# Patient Record
Sex: Female | Born: 2000 | Race: Black or African American | Hispanic: No | Marital: Single | Smoking: Never smoker
Health system: Southern US, Community
[De-identification: ages and names within clinical notes are randomized; demographics above are authoritative.]

---
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
## 2022-03-03 IMAGING — US US  BREAST BX W/ LOC DEV 1ST LESION IMG BX SPEC US GUIDE*R*
1 series · 13 of 14 positions shown · non-contrast
Comparison: Previous exam(s).
COMPARISON: Previous exam(s).

Addendum:
CLINICAL DATA: 19-year-old female presenting for ultrasound-guided
biopsy of a right breast mass.

EXAM:
ULTRASOUND GUIDED RIGHT BREAST CORE NEEDLE BIOPSY

[Series 1: us breast bx w/ loc dev 1st lesion img bx spec us  · 0.08mm/px · 13 of 14 slices shown]
[im 1/14]
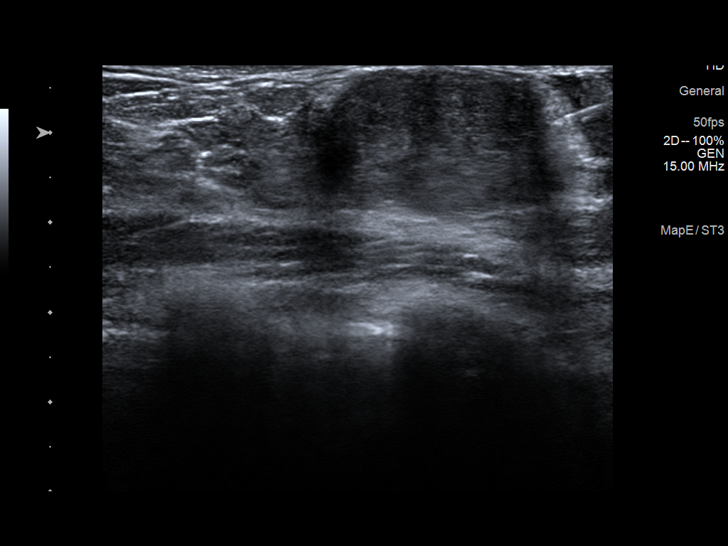
[im 2/14]
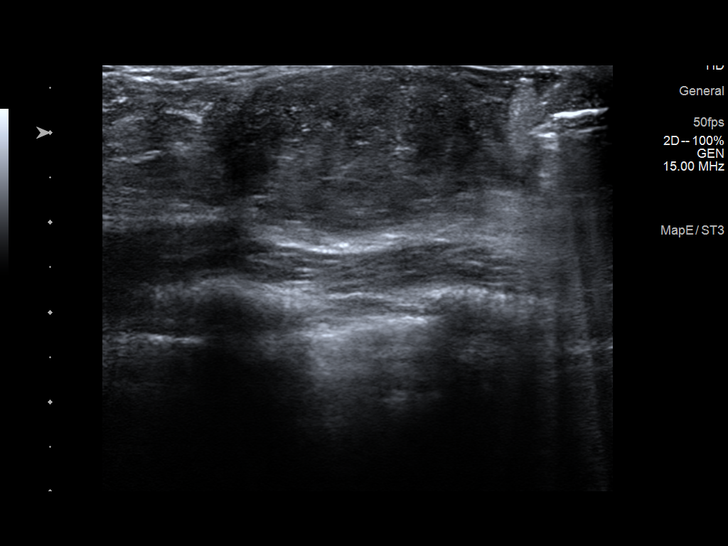
[im 3/14]
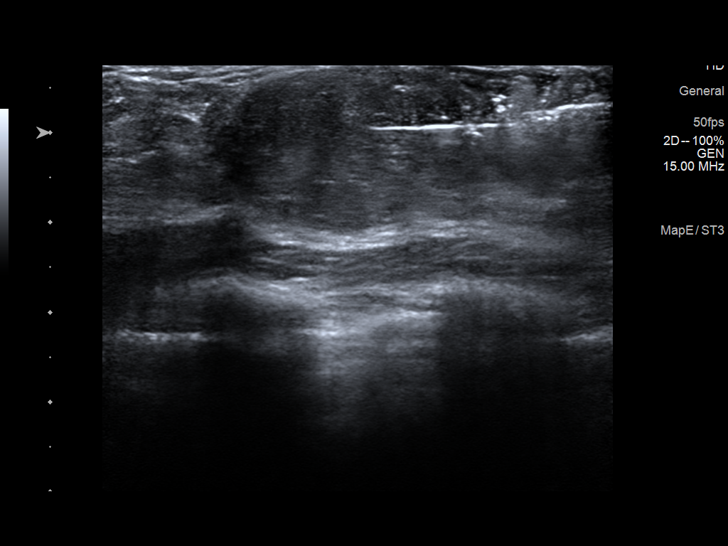
[im 4/14]
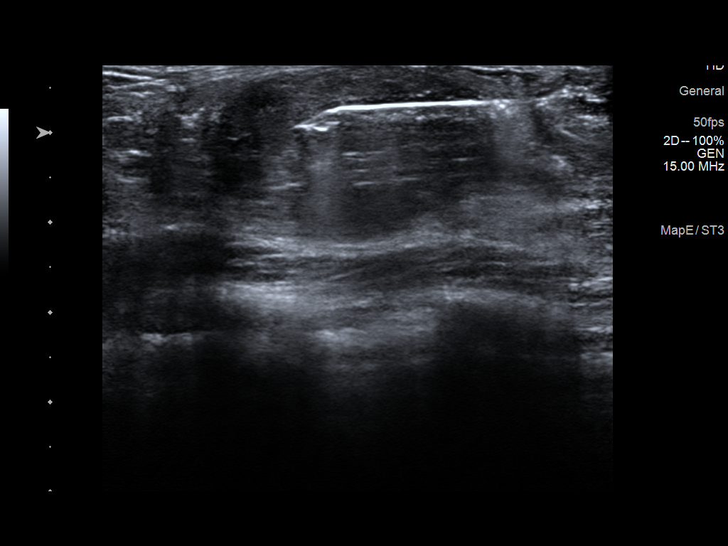
[im 5/14]
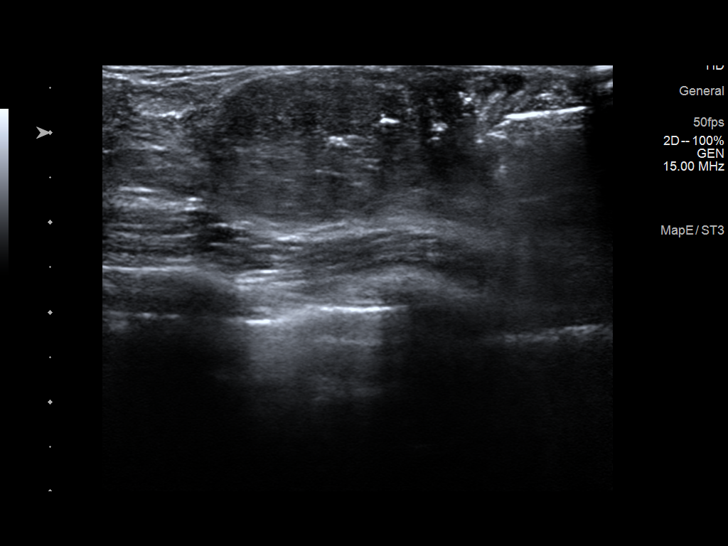
[im 6/14]
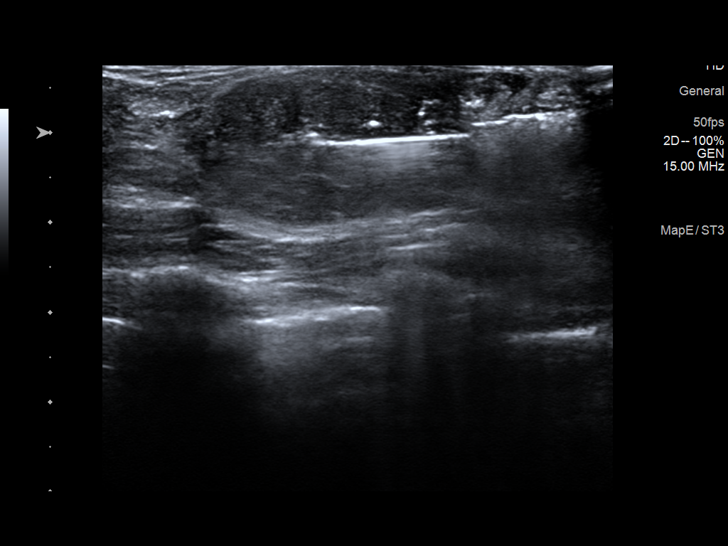
[im 8/14]
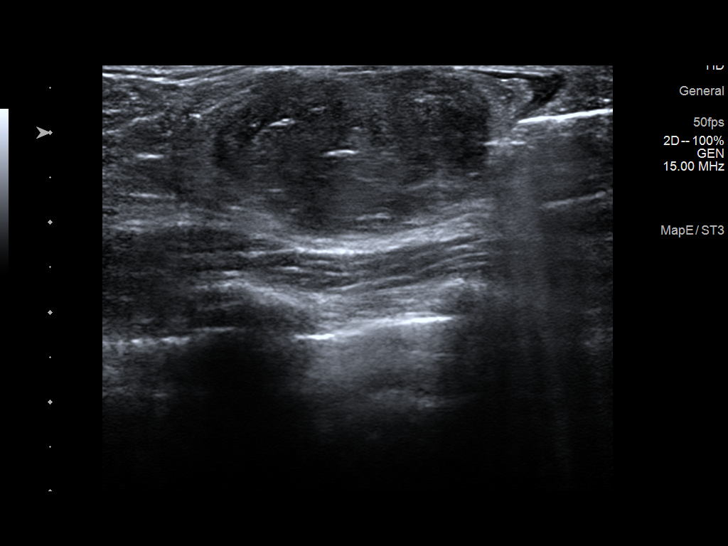
[im 9/14]
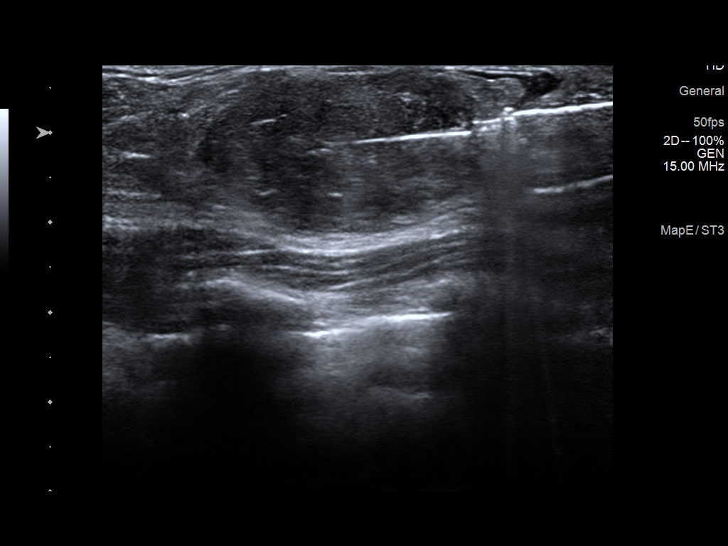
[im 10/14]
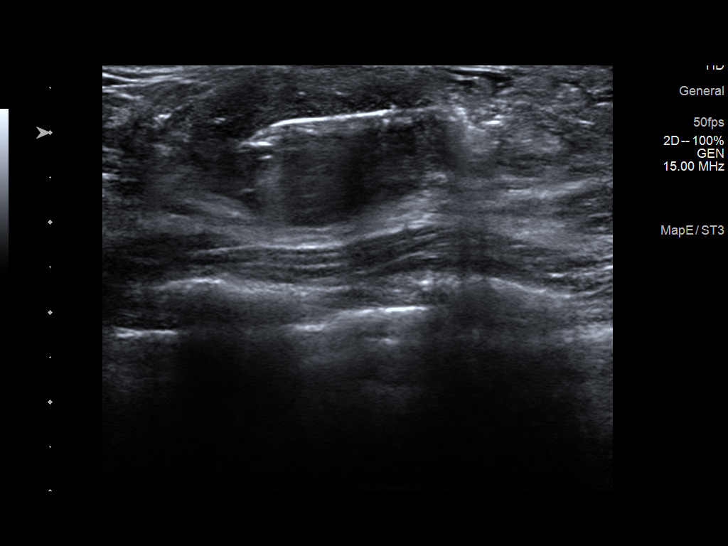
[im 11/14]
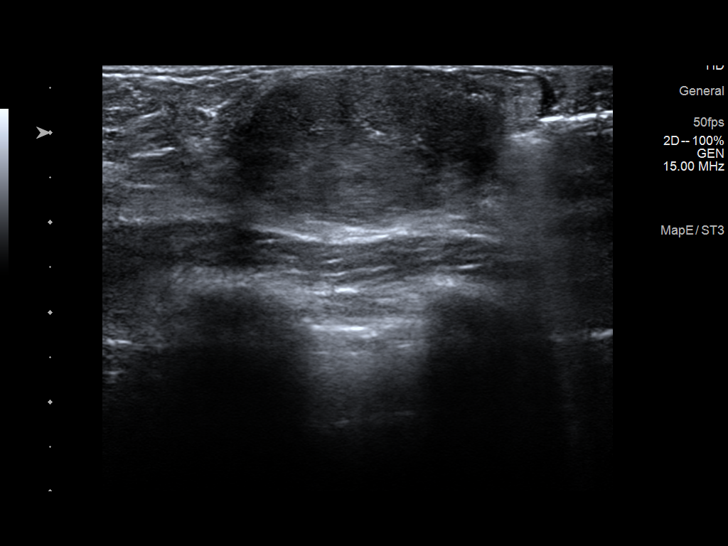
[im 12/14]
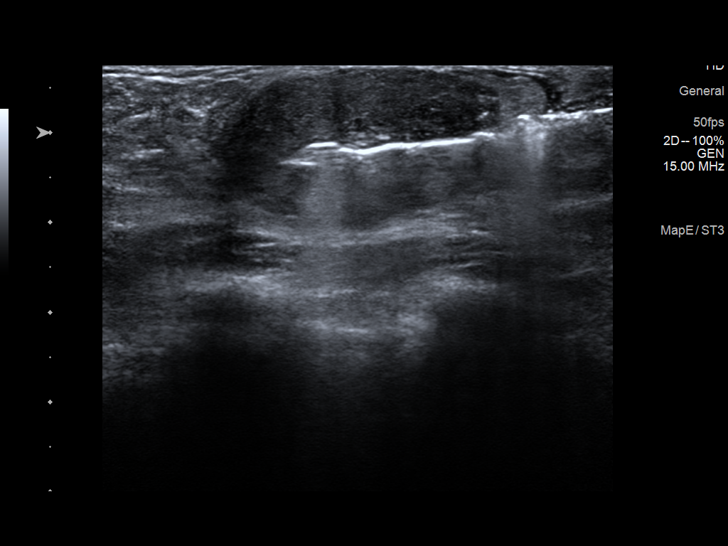
[im 13/14]
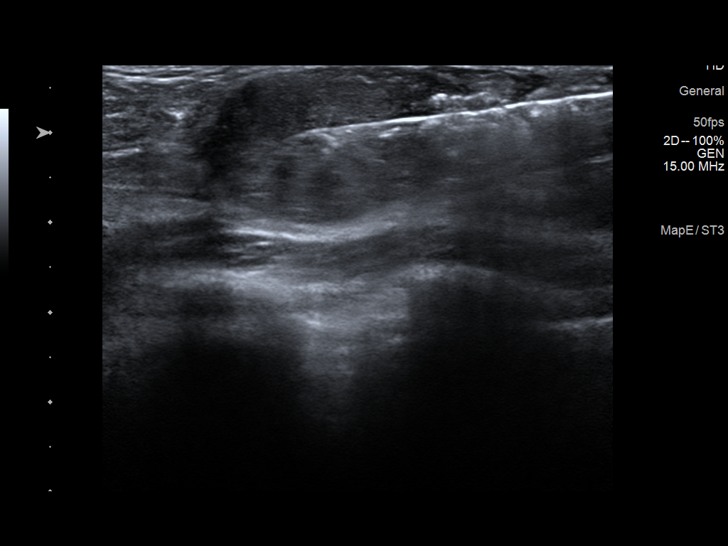
[im 14/14]
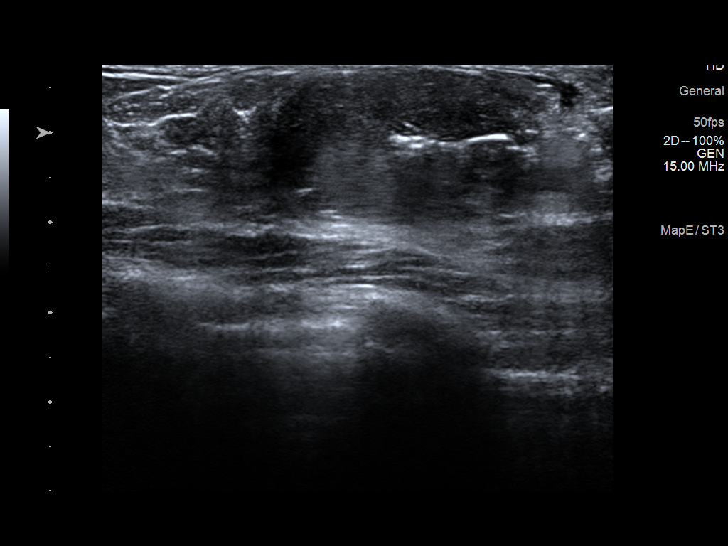

[13 of 14 positions shown; findings below may reference images not displayed]



Lesion quadrant: Lower inner quadrant

Using sterile technique and 1% Lidocaine as local anesthetic, under
direct ultrasound visualization, a 14 gauge Lucid device was
used to perform biopsy of a mass in the right breast at 4 o'clock
using an inferior approach. At the conclusion of the procedure a
ribbon shaped tissue marker clip was deployed into the biopsy
cavity.
IMPRESSION: Ultrasound guided biopsy of a right breast mass at 4 o'clock. No
apparent complications.

ADDENDUM:
Pathology revealed FIBROADENOMA of the Right breast, [DATE]. This was
found to be concordant by Dr. Nomasibulele Moatshe.

Pathology results were discussed with the patient and her mother,
Keke Del Sol by telephone, per request. The patient's mother reported
her daughter did well after the biopsy with tenderness and bleeding
at the site. Post biopsy instructions and care were reviewed and
questions were answered. The patient and her mother were encouraged
to call The [REDACTED] for any additional
concerns. My direct phone number was provided.

The patient was instructed to continue with monthly self breast
examinations, clinical follow-up as needed, and to return for annual
mammography at 42.

Pathology results reported by Hmanuel Gandara, RN on 05/14/2020.



Lesion quadrant: Lower inner quadrant

Using sterile technique and 1% Lidocaine as local anesthetic, under
direct ultrasound visualization, a 14 gauge Lucid device was
used to perform biopsy of a mass in the right breast at 4 o'clock
using an inferior approach. At the conclusion of the procedure a
ribbon shaped tissue marker clip was deployed into the biopsy
cavity.
IMPRESSION: Ultrasound guided biopsy of a right breast mass at 4 o'clock. No
apparent complications.
